# Patient Record
Sex: Male | Born: 1995 | Race: White | Hispanic: No | Marital: Single | Smoking: Never smoker
Health system: Southern US, Community
[De-identification: ages and names within clinical notes are randomized; demographics above are authoritative.]

---

## 2018-10-02 ENCOUNTER — Encounter: Payer: Self-pay | Admitting: Emergency Medicine

## 2018-10-02 ENCOUNTER — Other Ambulatory Visit: Payer: Self-pay

## 2018-10-02 ENCOUNTER — Ambulatory Visit
Admission: EM | Admit: 2018-10-02 | Discharge: 2018-10-02 | Disposition: A | Discharge status: Home / Self Care | Payer: BLUE CROSS/BLUE SHIELD | Attending: Family Medicine | Admitting: Family Medicine

## 2018-10-02 DIAGNOSIS — J039 Acute tonsillitis, unspecified: Secondary | ICD-10-CM

## 2018-10-02 DIAGNOSIS — J029 Acute pharyngitis, unspecified: Secondary | ICD-10-CM

## 2018-10-02 LAB — RAPID STREP SCREEN (MED CTR MEBANE ONLY): Streptococcus, Group A Screen (Direct): NEGATIVE

## 2018-10-02 MED ORDER — PENICILLIN G BENZATHINE 1200000 UNIT/2ML IM SUSP
1.2000 10*6.[IU] | Freq: Once | INTRAMUSCULAR | Status: AC
  Administered 2018-10-02: 1.2 10*6.[IU] via INTRAMUSCULAR

## 2018-10-02 MED ORDER — LIDOCAINE VISCOUS HCL 2 % MT SOLN: OROMUCOSAL | 0 refills | Status: AC

## 2018-10-02 MED ORDER — PREDNISONE 20 MG PO TABS: 20.0000 mg | ORAL_TABLET | Freq: Every day | ORAL | 0 refills | Status: AC

## 2018-10-02 MED ORDER — AZITHROMYCIN 250 MG PO TABS: ORAL_TABLET | ORAL | 0 refills | Status: AC

## 2018-10-02 NOTE — ED Triage Notes (Signed)
Patient c/o sore throat for a week.  Patient denies fevers.  

## 2018-10-02 NOTE — ED Provider Notes (Addendum)
[facility]    CSN: [id number] Arrival date & time: 10/02/18  0903     History   Chief Complaint Chief Complaint  Patient presents with  . Sore Throat    HPI Cody Fowler is a 22 y.o. male.   22 yo male with a c/o sore throat for one week. C/o pain with swallowing. Denies any trouble breathing, fevers, chills, shortness of breath.   The history is provided by the patient.    History reviewed. No pertinent past medical history.  There are no active problems to display for this patient.   History reviewed. No pertinent surgical history.     Home Medications    Prior to Admission medications   Medication Sig Start Date End Date Taking? Authorizing Provider  azithromycin (ZITHROMAX Z-PAK) 250 MG tablet 2 tabs po once today, then 1 tab po qd for next 4 days 10/02/18   Payton Mccallum, MD  lidocaine (XYLOCAINE) 2 % solution 20 ml gargle and spit q 6 hours prn sore throat 10/02/18   Payton Mccallum, MD  predniSONE (DELTASONE) 20 MG tablet Take 1 tablet (20 mg total) by mouth daily. 10/02/18   Payton Mccallum, MD    Family History Family History  Problem Relation Age of Onset  . Healthy Mother   . Healthy Father     Social History Social History   Tobacco Use  . Smoking status: Never Smoker  . Smokeless tobacco: Never Used  Substance Use Topics  . Alcohol use: Never    Frequency: Never  . Drug use: Never     Allergies   Patient has no known allergies.   Review of Systems Review of Systems   Physical Exam Triage Vital Signs ED Triage Vitals  Enc Vitals Group     BP 10/02/18 0920 140/86     Pulse Rate 10/02/18 0920 (!) 120     Resp 10/02/18 0920 16     Temp 10/02/18 0920 99.1 F (37.3 C)     Temp Source 10/02/18 0920 Oral     SpO2 10/02/18 0920 100 %     Weight 10/02/18 0918 130 lb (59 kg)     Height 10/02/18 0918 6' (1.829 m)     Head Circumference --      Peak Flow --      Pain Score 10/02/18 0920 5     Pain Loc --      Pain  Edu? --      Excl. in GC? --    No data found.  Updated Vital Signs BP 140/86 (BP Location: Left Arm)   Pulse (!) 111   Temp 99.1 F (37.3 C) (Oral)   Resp 16   Ht 6' (1.829 m)   Wt 59 kg   SpO2 100%   BMI 17.63 kg/m   Visual Acuity Right Eye Distance:   Left Eye Distance:   Bilateral Distance:    Right Eye Near:   Left Eye Near:    Bilateral Near:     Physical Exam  Constitutional: He appears well-developed and well-nourished. No distress.  HENT:  Head: Normocephalic and atraumatic.  Mouth/Throat: Uvula is midline. Oropharyngeal exudate and posterior oropharyngeal erythema present. Tonsils are 2+ on the right. Tonsils are 2+ on the left. Tonsillar exudate.  Neck: Normal range of motion. Neck supple. No tracheal deviation present.  Cardiovascular: Tachycardia present.  Pulmonary/Chest: Effort normal. No respiratory distress.  Lymphadenopathy:    He has cervical adenopathy.  Skin: He is not diaphoretic.  Nursing note and vitals reviewed.    UC Treatments / Results  Labs (all labs ordered are listed, but only abnormal results are displayed) Labs Reviewed  RAPID STREP SCREEN ([facility] ONLY)  CULTURE, GROUP A STREP [facility])    EKG None  Radiology No results found.  Procedures Procedures (including critical care time)  Medications Ordered in UC Medications  penicillin g benzathine (BICILLIN LA) 1200000 UNIT/2ML injection 1.2 Million Units (1.2 Million Units Intramuscular Given 10/02/18 1009)    Initial Impression / Assessment and Plan / UC Course  I have reviewed the triage vital signs and the nursing notes.  Pertinent labs & imaging results that were available during my care of the patient were reviewed by me and considered in my medical decision making (see chart for details).      Final Clinical Impressions(s) / UC Diagnoses   Final diagnoses:  Acute pharyngitis, unspecified etiology  Acute tonsillitis, unspecified etiology   Discharge  Instructions   None    ED Prescriptions    Medication Sig Dispense Auth. Provider   azithromycin (ZITHROMAX Z-PAK) 250 MG tablet 2 tabs po once today, then 1 tab po qd for next 4 days 6 each Payton Mccallum, MD   predniSONE (DELTASONE) 20 MG tablet Take 1 tablet (20 mg total) by mouth daily. 5 tablet Balen Woolum, Pamala Hurry, MD   lidocaine (XYLOCAINE) 2 % solution 20 ml gargle and spit q 6 hours prn sore throat 100 mL Payton Mccallum, MD     1. Lab results and diagnosis reviewed with patient 2. Patient given Bicillin LA 1.2 mU x 1 3. rx as per orders above; reviewed possible side effects, interactions, risks and benefits  4. Recommend supportive treatment with otc analgesics prn 5. Follow-up prn if symptoms worsen or don't improve   Controlled Substance Prescriptions [place] Controlled Substance Registry consulted? Not Applicable   Payton Mccallum, MD 10/02/18 1040    Payton Mccallum, MD 10/02/18 1043

## 2018-10-04 ENCOUNTER — Other Ambulatory Visit: Payer: Self-pay

## 2018-10-04 ENCOUNTER — Emergency Department
Admission: EM | Admit: 2018-10-04 | Discharge: 2018-10-04 | Disposition: A | Discharge status: Home / Self Care | Payer: BLUE CROSS/BLUE SHIELD | Attending: Emergency Medicine | Admitting: Emergency Medicine

## 2018-10-04 ENCOUNTER — Emergency Department: Payer: BLUE CROSS/BLUE SHIELD

## 2018-10-04 DIAGNOSIS — J029 Acute pharyngitis, unspecified: Secondary | ICD-10-CM | POA: Diagnosis present

## 2018-10-04 DIAGNOSIS — J039 Acute tonsillitis, unspecified: Secondary | ICD-10-CM | POA: Insufficient documentation

## 2018-10-04 LAB — CBC WITH DIFFERENTIAL/PLATELET
Abs Immature Granulocytes: 0.1 10*3/uL — ABNORMAL HIGH (ref 0.00–0.07)
BASOS ABS: 0.1 10*3/uL (ref 0.0–0.1)
Basophils Relative: 1 %
EOS ABS: 0 10*3/uL (ref 0.0–0.5)
EOS PCT: 0 %
HEMATOCRIT: 41.6 % (ref 39.0–52.0)
Hemoglobin: 13.7 g/dL (ref 13.0–17.0)
Immature Granulocytes: 1 %
Lymphocytes Relative: 48 %
Lymphs Abs: 3.9 10*3/uL (ref 0.7–4.0)
MCH: 28.5 pg (ref 26.0–34.0)
MCHC: 32.9 g/dL (ref 30.0–36.0)
MCV: 86.5 fL (ref 80.0–100.0)
Monocytes Absolute: 0.9 10*3/uL (ref 0.1–1.0)
Monocytes Relative: 11 %
NEUTROS PCT: 39 %
NRBC: 0 % (ref 0.0–0.2)
Neutro Abs: 3.2 10*3/uL (ref 1.7–7.7)
Platelets: 363 10*3/uL (ref 150–400)
RBC: 4.81 MIL/uL (ref 4.22–5.81)
RDW: 13.4 % (ref 11.5–15.5)
Smear Review: NORMAL
WBC MORPHOLOGY: REACTIVE
WBC: 8.1 10*3/uL (ref 4.0–10.5)

## 2018-10-04 LAB — BASIC METABOLIC PANEL
ANION GAP: 11 (ref 5–15)
BUN: 11 mg/dL (ref 6–20)
CALCIUM: 9.1 mg/dL (ref 8.9–10.3)
CO2: 24 mmol/L (ref 22–32)
CREATININE: 0.91 mg/dL (ref 0.61–1.24)
Chloride: 101 mmol/L (ref 98–111)
GFR calc non Af Amer: >60 mL/min (ref >60)
Glucose, Bld: 107 mg/dL — ABNORMAL HIGH (ref 70–99)
Potassium: 6.2 mmol/L — ABNORMAL HIGH (ref 3.5–5.1)
SODIUM: 136 mmol/L (ref 135–145)

## 2018-10-04 LAB — MONONUCLEOSIS SCREEN: Mono Screen: NEGATIVE

## 2018-10-04 MED ORDER — IOHEXOL 300 MG/ML  SOLN
75.0000 mL | Freq: Once | INTRAMUSCULAR | Status: AC | PRN
  Administered 2018-10-04: 75 mL via INTRAVENOUS
  Filled 2018-10-04: qty 75

## 2018-10-04 MED ORDER — PROMETHAZINE-CODEINE 6.25-10 MG/5ML PO SYRP: 5.0000 mL | ORAL_SOLUTION | Freq: Four times a day (QID) | ORAL | 0 refills | Status: AC | PRN

## 2018-10-04 MED ORDER — SODIUM CHLORIDE 0.9 % IV BOLUS
1000.0000 mL | Freq: Once | INTRAVENOUS | Status: AC
  Administered 2018-10-04: 1000 mL via INTRAVENOUS

## 2018-10-04 MED ORDER — METHYLPREDNISOLONE SODIUM SUCC 125 MG IJ SOLR
80.0000 mg | Freq: Once | INTRAMUSCULAR | Status: AC
  Administered 2018-10-04: 80 mg via INTRAVENOUS
  Filled 2018-10-04: qty 2

## 2018-10-04 NOTE — ED Provider Notes (Signed)
[facility] Emergency Department Provider Note   ____________________________________________   First MD Initiated Contact with Patient 10/04/18 1029     (approximate)  I have reviewed the triage vital signs and the nursing notes.   HISTORY  Chief Complaint Sore Throat    HPI Cody Fowler is a 22 y.o. male patient is here today for reevaluation of tonsillitis.  Patient was seen at urgent care clinic 3 days ago and diagnosed with tonsillitis based on exam since the rapid strep was negative.  Culture is still pending.  Patient was given [place] penicillin shot, steroids, and Zithromax.  Patient states on his third day of Zithromax.  Patient state noticed no improvement.  Patient states able to tolerate fluids but had difficulty with food.  Patient states dysphagia secondary to enlarged tonsils.  Patient rates his pain as a 7/10.  Patient described the pain is "sore".  History reviewed. No pertinent past medical history.  There are no active problems to display for this patient.   History reviewed. No pertinent surgical history.  Prior to Admission medications   Medication Sig Start Date End Date Taking? Authorizing Provider  azithromycin (ZITHROMAX Z-PAK) 250 MG tablet 2 tabs po once today, then 1 tab po qd for next 4 days 10/02/18   Payton Mccallum, MD  lidocaine (XYLOCAINE) 2 % solution 20 ml gargle and spit q 6 hours prn sore throat 10/02/18   Payton Mccallum, MD  predniSONE (DELTASONE) 20 MG tablet Take 1 tablet (20 mg total) by mouth daily. 10/02/18   Payton Mccallum, MD  promethazine-codeine (PHENERGAN WITH CODEINE) 6.25-10 MG/5ML syrup Take 5 mLs by mouth every 6 (six) hours as needed for cough. 10/04/18   Joni Reining, PA-C    Allergies Patient has no known allergies.  Family History  Problem Relation Age of Onset  . Healthy Mother   . Healthy Father     Social History Social History   Tobacco Use  . Smoking status: Never Smoker  .  Smokeless tobacco: Never Used  Substance Use Topics  . Alcohol use: Never    Frequency: Never  . Drug use: Never    Review of Systems Constitutional: No fever/chills Eyes: No visual changes. ENT: Sore throat. Cardiovascular: Denies chest pain. Respiratory: Denies shortness of breath. Gastrointestinal: No abdominal pain.  No nausea, no vomiting.  No diarrhea.  No constipation. Genitourinary: Negative for dysuria. Musculoskeletal: Negative for back pain. Skin: Negative for rash. Neurological: Negative for headaches, focal weakness or numbness.   ____________________________________________   PHYSICAL EXAM:  VITAL SIGNS: ED Triage Vitals  Enc Vitals Group     BP 10/04/18 1021 138/67     Pulse Rate 10/04/18 1021 (!) 122     Resp 10/04/18 1021 16     Temp 10/04/18 1021 98.5 F (36.9 C)     Temp Source 10/04/18 1021 Oral     SpO2 10/04/18 1021 100 %     Weight 10/04/18 1022 127 lb 13.9 oz (58 kg)     Height 10/04/18 1022 6' (1.829 m)     Head Circumference --      Peak Flow --      Pain Score 10/04/18 1021 7     Pain Loc --      Pain Edu? --      Excl. in GC? --    Constitutional: Alert and oriented. Well appearing and in no acute distress. Afebrile. Mouth/Throat: Mucous membranes are moist.  Oropharynx non-erythematous. Neck: No stridor.  Hematological/Lymphatic/Immunilogical: Bilateral cervical lymphadenopathy. Cardiovascular: Cardiac, regular rhythm. Grossly normal heart sounds.  Good peripheral circulation. Respiratory: Normal respiratory effort.  No retractions. Lungs CTAB. Neurologic:  Normal speech and language. No gross focal neurologic deficits are appreciated. No gait instability. Skin:  Skin is warm, dry and intact. No rash noted. Psychiatric: Mood and affect are normal. Speech and behavior are normal.  ____________________________________________   LABS (all labs ordered are listed, but only abnormal results are displayed)  Labs Reviewed  BASIC  METABOLIC PANEL - Abnormal; Notable for the following components:      Result Value   Potassium 6.2 (*)    Glucose, Bld 107 (*)    All other components within normal limits  CBC WITH DIFFERENTIAL/PLATELET - Abnormal; Notable for the following components:   Abs Immature Granulocytes 0.10 (*)    All other components within normal limits  MONONUCLEOSIS SCREEN   ____________________________________________  EKG   ____________________________________________  RADIOLOGY  ED MD interpretation:    Official radiology report(s): Ct Soft Tissue Neck W Contrast  Result Date: 10/04/2018 CLINICAL DATA:  Worsening pain with trouble breathing. Recently diagnosed with tonsillitis. EXAM: CT NECK WITH CONTRAST TECHNIQUE: Multidetector CT imaging of the neck was performed using the standard protocol following the bolus administration of intravenous contrast. CONTRAST:  75mL OMNIPAQUE IOHEXOL 300 MG/ML  SOLN COMPARISON:  None. FINDINGS: Pharynx and larynx: The palatine and adenoid tonsils are markedly thickened. Tonsils have a striated appearance from inflammation and probable surface purulence. There is no abscess or retropharyngeal edema. The supraglottic larynx shows no submucosal edema or narrowing. Salivary glands: No inflammation, mass, or stone. Thyroid: Normal. Lymph nodes: Enlarged homogeneous nodes throughout the bilateral jugular chains and in the submental spaces. Vascular: Major venous structures are patent Limited intracranial: Negative Visualized orbits: Negative Mastoids and visualized paranasal sinuses: Essentially clear Skeleton: Negative Upper chest: Small cluster of irregular ground-glass densities in the right upper lobe IMPRESSION: 1. Tonsillitis and cervical adenitis without abscess. 2. Mild right upper lobe opacity, likely infectious/inflammatory. Electronically Signed   By: Marnee SpringJonathon  Watts M.D.   On: 10/04/2018 12:51     ____________________________________________   PROCEDURES  Procedure(s) performed:   Procedures  Critical Care performed:   ____________________________________________   INITIAL IMPRESSION / ASSESSMENT AND PLAN / ED COURSE  As part of my medical decision making, I reviewed the following data within the electronic MEDICAL RECORD NUMBER    Patient presents with tonsillitis with no improvement after 3 days of antibiotics and steroids.  CT soft tissue neck confirmed tonsillitis with an inflammatory/infectious process.  Patient advised to continue previous medications.  Patient get a prescription for Phenergan with codeine to mix with his viscous lidocaine for swish and swallow.  Patient advised follow-up with the ENT clinic if there is no improvement in 2 days.      ____________________________________________   FINAL CLINICAL IMPRESSION(S) / ED DIAGNOSES  Final diagnoses:  Tonsillitis     ED Discharge Orders         Ordered    promethazine-codeine (PHENERGAN WITH CODEINE) 6.25-10 MG/5ML syrup  Every 6 hours PRN     10/04/18 1321           Note:  This document was prepared using Dragon voice recognition software and may include unintentional dictation errors.    Joni ReiningSmith, Ronald K, PA-C 10/04/18 1322    Emily FilbertWilliams, Jonathan E, MD 10/07/18 [phone]

## 2018-10-04 NOTE — Discharge Instructions (Addendum)
Continue previous medications and follow-up with the [facility] if no improvement.

## 2018-10-04 NOTE — ED Notes (Signed)
See triage note  States he was seen and placed on zpak and prednisone   This is the 3rd day of meds  Feels like throat is more swollen  Speech is muffled  Tonsils swollen with white exudate noted

## 2018-10-04 NOTE — ED Triage Notes (Signed)
Pt came to Ed via pov. Went to urgent care and was diagnosed with tonsilitis. Was given prednisone and azithromycin and today would be day 3 of patient taking meds. Has not taken them yet today. Pt reports pain is getting worse. Denies trouble breathing.

## 2018-10-05 LAB — CULTURE, GROUP A STREP (THRC)

## 2018-10-14 ENCOUNTER — Telehealth: Payer: Self-pay | Admitting: Emergency Medicine

## 2018-10-14 NOTE — Telephone Encounter (Signed)
called patient regarding FMLA paper work.  No answer and no voice mail set up.  [facility].  Paper work placed in envelope and left with front office staff ready for pick up.

## 2019-07-09 IMAGING — CT CT NECK W/ CM
3 of 5 series · 13 of 33 positions shown, 16 images · IV contrast (omnipaque)
Comparison: None.

CLINICAL DATA: Worsening pain with trouble breathing. Recently
diagnosed with tonsillitis.

EXAM:
CT NECK WITH CONTRAST
TECHNIQUE: Multidetector CT imaging of the neck was performed using the
standard protocol following the bolus administration of intravenous
contrast.
CONTRAST:  75mL OMNIPAQUE IOHEXOL 300 MG/ML  SOLN

[Series 6: sag neck · sagittal · 0.58mm/px · 5 of 150 slices shown, 6 images]
[im 50/150  bone]
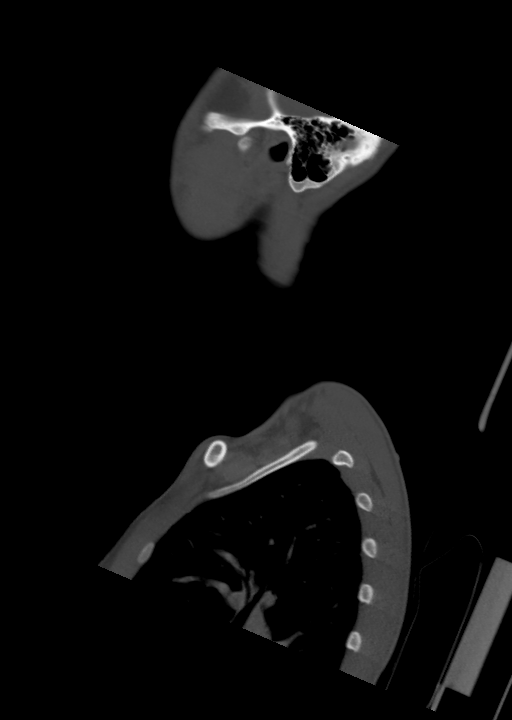
[im 63/150  bone]
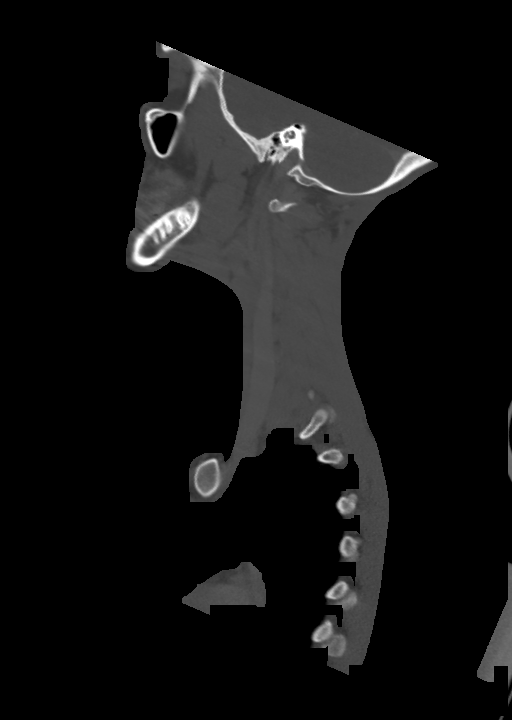
[im 75/150  soft-tissue]
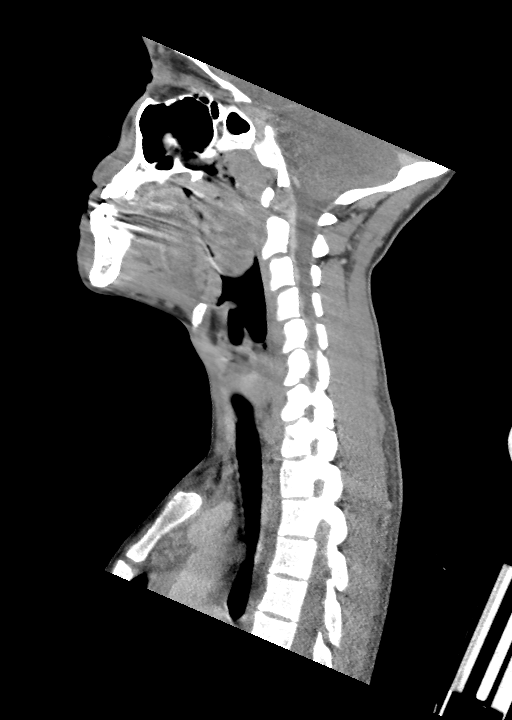
[im 75/150  bone]
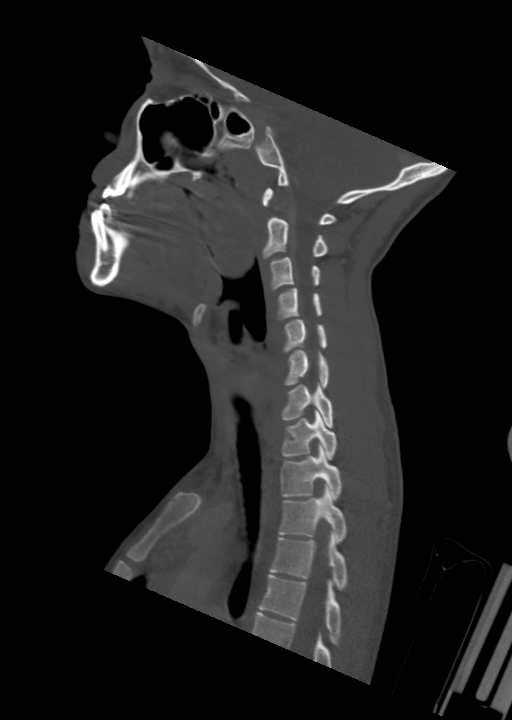
[im 87/150  bone]
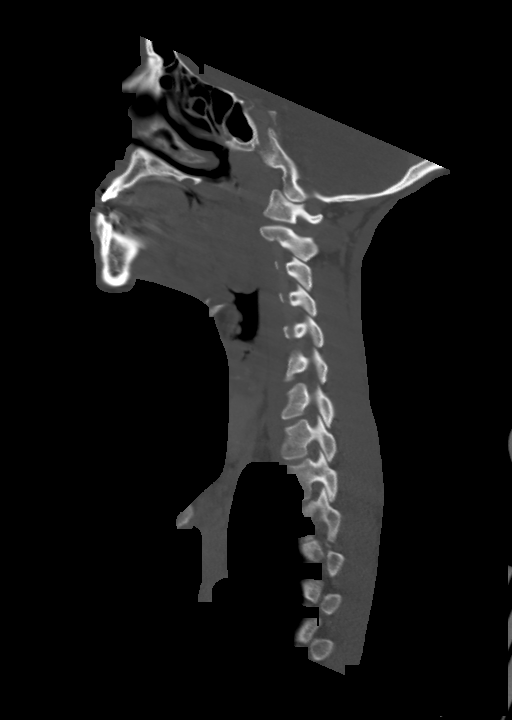
[im 100/150  bone]
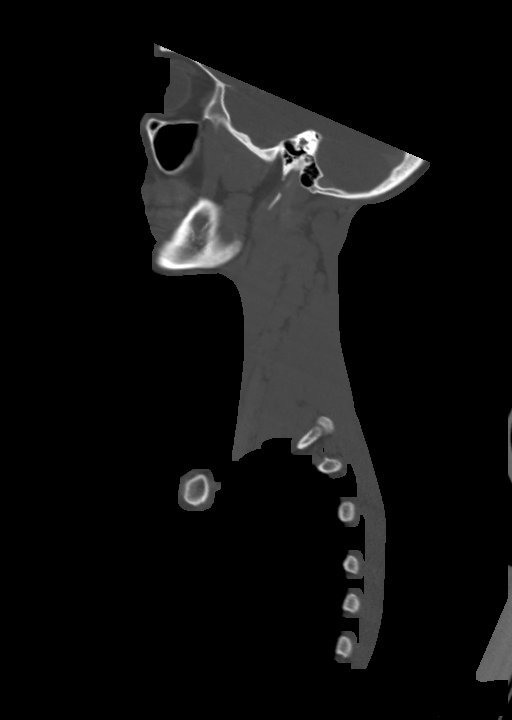

[Series 7: cor neck · coronal · 0.57mm/px · 3 of 148 slices shown]
[im 52/148  bone]
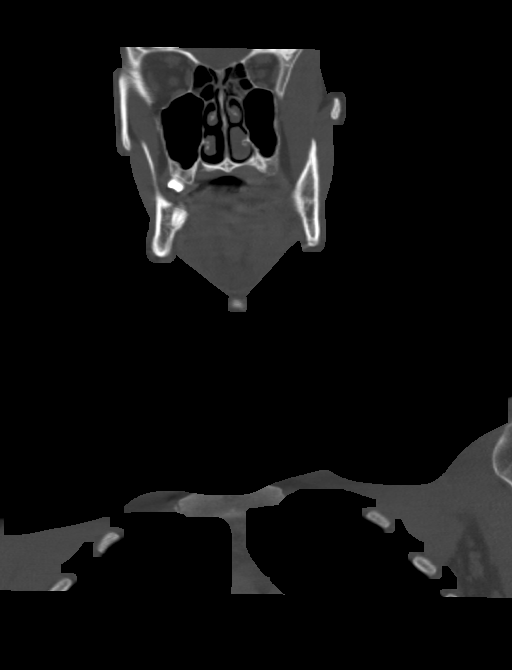
[im 67/148  bone]
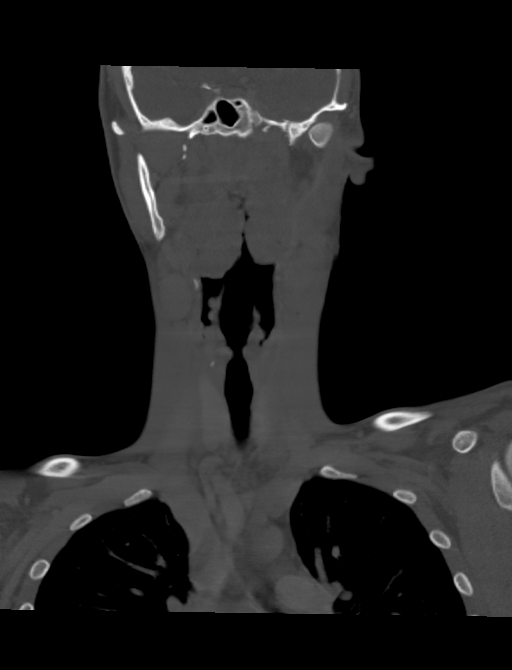
[im 81/148  bone]
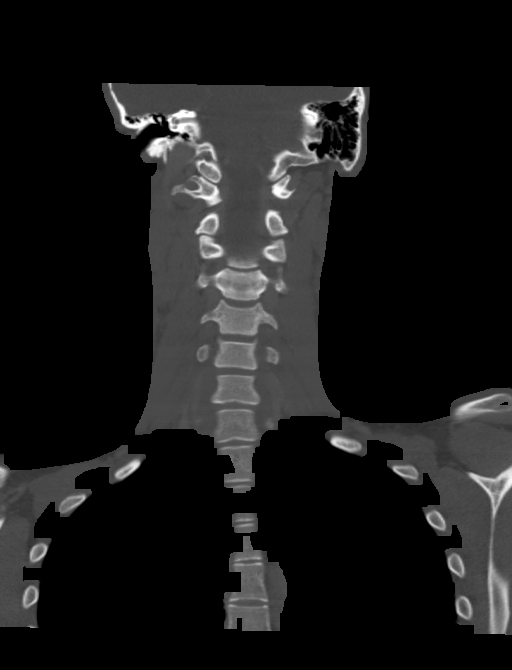

[Series 8: orthogonal ax · axial · 0.58mm/px · z∈[-94,+135]mm · 5 of 188 slices shown, 7 images]
[im 32/188  soft-tissue]
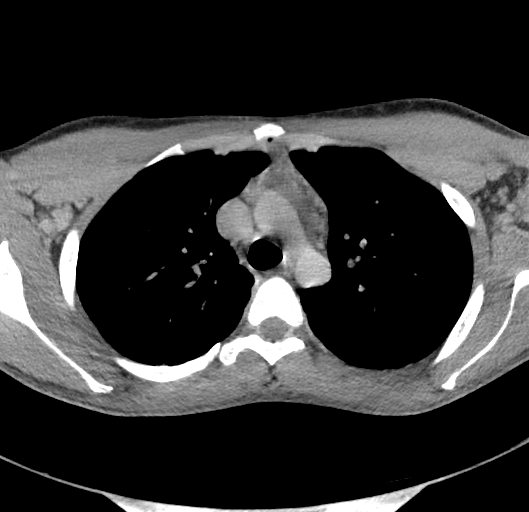
[im 32/188  bone]
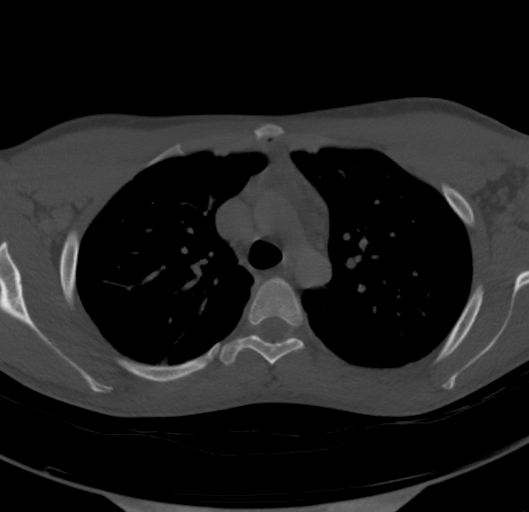
[im 63/188  bone]
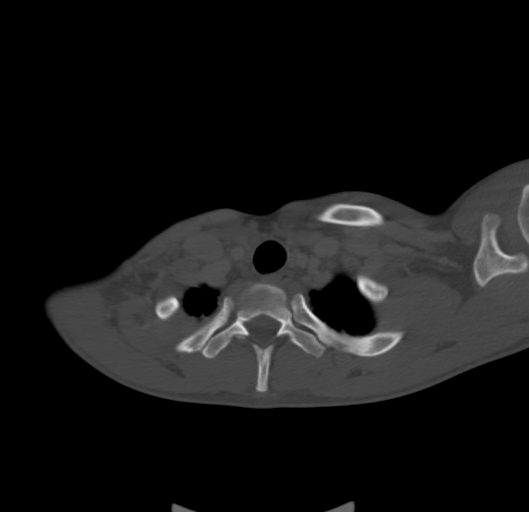
[im 94/188  bone]
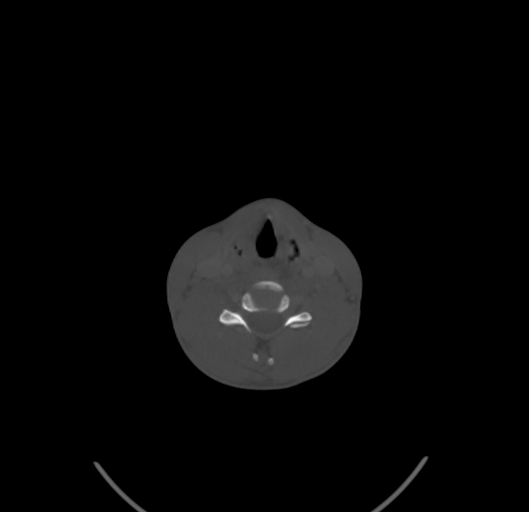
[im 125/188  bone]
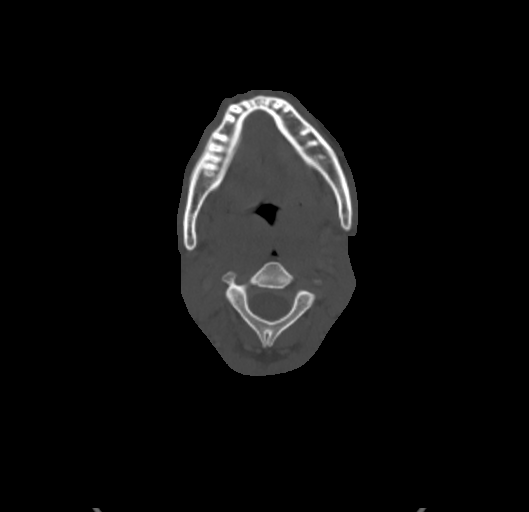
[im 156/188  soft-tissue]
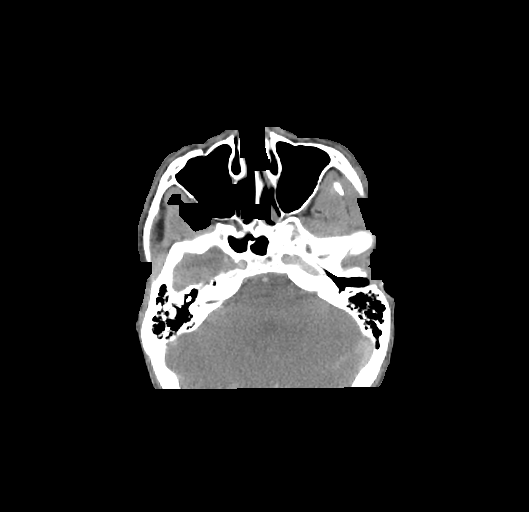
[im 156/188  bone]
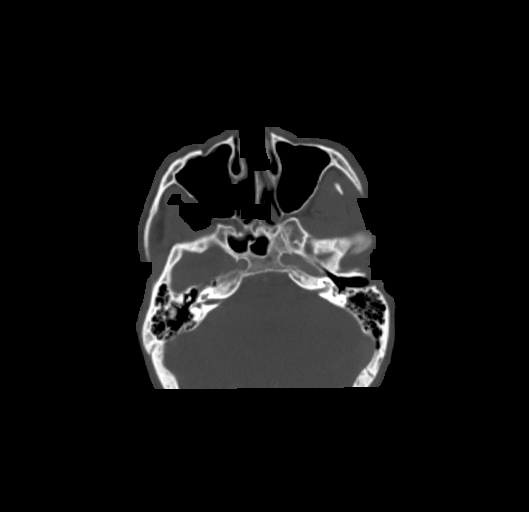

[13 of 33 positions shown; findings below may reference images not displayed]

FINDINGS: Pharynx and larynx: The palatine and adenoid tonsils are markedly
thickened. Tonsils have a striated appearance from inflammation and
probable surface purulence. There is no abscess or retropharyngeal
edema. The supraglottic larynx shows no submucosal edema or
narrowing.

Salivary glands: No inflammation, mass, or stone.

Thyroid: Normal.

Lymph nodes: Enlarged homogeneous nodes throughout the bilateral
jugular chains and in the submental spaces.

Vascular: Major venous structures are patent

Limited intracranial: Negative

Visualized orbits: Negative

Mastoids and visualized paranasal sinuses: Essentially clear

Skeleton: Negative

Upper chest: Small cluster of irregular ground-glass densities in
the right upper lobe
IMPRESSION: 1. Tonsillitis and cervical adenitis without abscess.
2. Mild right upper lobe opacity, likely infectious/inflammatory.

## 2019-10-09 ENCOUNTER — Other Ambulatory Visit: Payer: Self-pay

## 2019-10-09 DIAGNOSIS — Z20822 Contact with and (suspected) exposure to covid-19: Secondary | ICD-10-CM

## 2019-10-11 LAB — NOVEL CORONAVIRUS, NAA: SARS-CoV-2, NAA: NOT DETECTED

## 2019-10-12 ENCOUNTER — Telehealth: Payer: Self-pay | Admitting: General Practice

## 2019-10-12 NOTE — Telephone Encounter (Signed)
Negative COVID results given. Patient results "NOT Detected." Caller expressed understanding. ° °

## 2020-04-15 ENCOUNTER — Other Ambulatory Visit: Payer: Self-pay

## 2020-04-15 ENCOUNTER — Encounter: Payer: Self-pay | Admitting: Emergency Medicine

## 2020-04-15 ENCOUNTER — Ambulatory Visit
Admission: EM | Admit: 2020-04-15 | Discharge: 2020-04-15 | Disposition: A | Discharge status: Home / Self Care | Payer: BC Managed Care – PPO | Attending: Family Medicine | Admitting: Family Medicine

## 2020-04-15 DIAGNOSIS — J039 Acute tonsillitis, unspecified: Secondary | ICD-10-CM | POA: Diagnosis not present

## 2020-04-15 LAB — GROUP A STREP BY PCR: Group A Strep by PCR: NOT DETECTED

## 2020-04-15 MED ORDER — AMOXICILLIN 875 MG PO TABS: 875.0000 mg | ORAL_TABLET | Freq: Two times a day (BID) | ORAL | 0 refills | Status: DC

## 2020-04-15 NOTE — Discharge Instructions (Signed)
Salt water gargles Tylenol/advil as needed  

## 2020-04-15 NOTE — ED Provider Notes (Signed)
[facility]    CSN: [id number] Arrival date & time: 04/15/20  1430      History   Chief Complaint Chief Complaint  Patient presents with  . Sore Throat    HPI Cody Fowler is a 24 y.o. male.   24 yo male with a c/o sore throat for the past 2-3 days. States it's painful to swallow. Denies any nasal congestion, runny nose, cough, ear pain, drooling, trouble breathing. No known sick contacts. Otherwise healthy.    Sore Throat    History reviewed. No pertinent past medical history.  There are no problems to display for this patient.   History reviewed. No pertinent surgical history.     Home Medications    Prior to Admission medications   Medication Sig Start Date End Date Taking? Authorizing Provider  amoxicillin (AMOXIL) 875 MG tablet Take 1 tablet (875 mg total) by mouth 2 (two) times daily. 04/15/20   Payton Mccallum, MD  azithromycin (ZITHROMAX Z-PAK) 250 MG tablet 2 tabs po once today, then 1 tab po qd for next 4 days 10/02/18   Payton Mccallum, MD  lidocaine (XYLOCAINE) 2 % solution 20 ml gargle and spit q 6 hours prn sore throat 10/02/18   Payton Mccallum, MD  predniSONE (DELTASONE) 20 MG tablet Take 1 tablet (20 mg total) by mouth daily. 10/02/18   Payton Mccallum, MD  promethazine-codeine (PHENERGAN WITH CODEINE) 6.25-10 MG/5ML syrup Take 5 mLs by mouth every 6 (six) hours as needed for cough. 10/04/18   Joni Reining, PA-C    Family History Family History  Problem Relation Age of Onset  . Healthy Mother   . Healthy Father     Social History Social History   Tobacco Use  . Smoking status: Never Smoker  . Smokeless tobacco: Never Used  Substance Use Topics  . Alcohol use: Never  . Drug use: Never     Allergies   Patient has no known allergies.   Review of Systems Review of Systems   Physical Exam Triage Vital Signs ED Triage Vitals  Enc Vitals Group     BP 04/15/20 1500 124/83     Pulse Rate 04/15/20 1500 79     Resp  04/15/20 1500 18     Temp 04/15/20 1500 98.6 F (37 C)     Temp Source 04/15/20 1500 Oral     SpO2 04/15/20 1500 100 %     Weight 04/15/20 1457 127 lb 13.9 oz (58 kg)     Height 04/15/20 1457 6' (1.829 m)     Head Circumference --      Peak Flow --      Pain Score 04/15/20 1457 0     Pain Loc --      Pain Edu? --      Excl. in GC? --    No data found.  Updated Vital Signs BP 124/83 (BP Location: Right Arm)   Pulse 79   Temp 98.6 F (37 C) (Oral)   Resp 18   Ht 6' (1.829 m)   Wt 58 kg   SpO2 100%   BMI 17.34 kg/m   Visual Acuity Right Eye Distance:   Left Eye Distance:   Bilateral Distance:    Right Eye Near:   Left Eye Near:    Bilateral Near:     Physical Exam Vitals and nursing note reviewed.  Constitutional:      General: He is not in acute distress.    Appearance: He is  not toxic-appearing or diaphoretic.  HENT:     Mouth/Throat:     Pharynx: Uvula midline. Oropharyngeal exudate and posterior oropharyngeal erythema present.     Tonsils: Tonsillar exudate present. 2+ on the right. 2+ on the left.  Pulmonary:     Effort: Pulmonary effort is normal. No respiratory distress.  Musculoskeletal:     Cervical back: Neck [name].  Neurological:     Mental Status: He is alert.      UC Treatments / Results  Labs (all labs ordered are listed, but only abnormal results are displayed) Labs Reviewed  GROUP A STREP BY PCR    EKG   Radiology No results found.  Procedures Procedures (including critical care time)  Medications Ordered in UC Medications - No data to display  Initial Impression / Assessment and Plan / UC Course  I have reviewed the triage vital signs and the nursing notes.  Pertinent labs & imaging results that were available during my care of the patient were reviewed by me and considered in my medical decision making (see chart for details).     Final Clinical Impressions(s) / UC Diagnoses   Final diagnoses:  Tonsillitis      Discharge Instructions     Salt water gargles Tylenol/advil as needed    ED Prescriptions    Medication Sig Dispense Auth. Provider   amoxicillin (AMOXIL) 875 MG tablet Take 1 tablet (875 mg total) by mouth 2 (two) times daily. 20 tablet Norval Gable, MD      1. diagnosis reviewed with patient 2. rx as per orders above; reviewed possible side effects, interactions, risks and benefits  3. Recommend supportive treatment with otc analgesics, salt water gargles 4. Follow-up prn if symptoms worsen or don't improve  PDMP not reviewed this encounter.   Norval Gable, MD 04/15/20 1538

## 2020-04-15 NOTE — ED Triage Notes (Signed)
Pt c/o sore throat. Started about 2 days ago. He states his tonsil on the right side is swollen. He states it does not hurt today. He has had tonsillitis in the past and states this feels similar.

## 2024-12-08 ENCOUNTER — Ambulatory Visit
Admission: EM | Admit: 2024-12-08 | Discharge: 2024-12-08 | Disposition: A | Discharge status: Home / Self Care | Attending: Emergency Medicine | Admitting: Emergency Medicine

## 2024-12-08 ENCOUNTER — Encounter: Payer: Self-pay | Admitting: Emergency Medicine

## 2024-12-08 DIAGNOSIS — J029 Acute pharyngitis, unspecified: Secondary | ICD-10-CM | POA: Insufficient documentation

## 2024-12-08 LAB — POCT RAPID STREP A (OFFICE): Rapid Strep A Screen: NEGATIVE

## 2024-12-08 NOTE — ED Triage Notes (Signed)
 Pt c/o sore throat. Started about 3 days ago. Denies fever or there URI symptoms.

## 2024-12-08 NOTE — Discharge Instructions (Addendum)
 Your strep test is negative, throat culture pending, if your culture is positive we will call you and amoxicillin , if your culture is negative you will not be notified.  Please check MyChart for results.  Push plenty of fluids, avoid caffeine, may alternate and take Tylenol /ibuprofen as label directed for pain

## 2024-12-08 NOTE — ED Provider Notes (Signed)
 " [facility]    CSN: [id number] Arrival date & time: 12/08/24  1509      History   Chief Complaint Chief Complaint  Patient presents with   Sore Throat    HPI Cody Fowler is a 29 y.o. male.   29 year old male pt, Cody Fowler, presents to urgent care for evaluation of sore throat x 3 days. Pt denies fever or URI symptoms.    The history is provided by the patient. No language interpreter was used.  Sore Throat    History reviewed. No pertinent past medical history.  Patient Active Problem List   Diagnosis Date Noted   Viral pharyngitis 12/08/2024    History reviewed. No pertinent surgical history.     Home Medications    Prior to Admission medications  Medication Sig Start Date End Date Taking? Authorizing Provider  amoxicillin  (AMOXIL ) 875 MG tablet Take 1 tablet (875 mg total) by mouth 2 (two) times daily. 04/15/20   Servando Hire, MD  azithromycin  (ZITHROMAX  Z-PAK) 250 MG tablet 2 tabs po once today, then 1 tab po qd for next 4 days 10/02/18   Servando Hire, MD  lidocaine  (XYLOCAINE ) 2 % solution 20 ml gargle and spit q 6 hours prn sore throat 10/02/18   Servando Hire, MD  predniSONE  (DELTASONE ) 20 MG tablet Take 1 tablet (20 mg total) by mouth daily. 10/02/18   Servando Hire, MD  promethazine -codeine  (PHENERGAN  WITH CODEINE ) 6.25-10 MG/5ML syrup Take 5 mLs by mouth every 6 (six) hours as needed for cough. 10/04/18   Claudene Tanda POUR, PA-C    Family History Family History  Problem Relation Age of Onset   Healthy Mother    Healthy Father     Social History Social History[1]   Allergies   Patient has no known allergies.   Review of Systems Review of Systems  Constitutional:  Negative for fever.  HENT:  Positive for sore throat.   All other systems reviewed and are negative.    Physical Exam Triage Vital Signs ED Triage Vitals  Encounter Vitals Group     BP 12/08/24 1523 137/78     Girls Systolic BP Percentile --      Girls  Diastolic BP Percentile --      Boys Systolic BP Percentile --      Boys Diastolic BP Percentile --      Pulse Rate 12/08/24 1523 90     Resp 12/08/24 1523 16     Temp 12/08/24 1523 98.9 F (37.2 C)     Temp Source 12/08/24 1523 Oral     SpO2 12/08/24 1523 96 %     Weight 12/08/24 1518 127 lb 13.9 oz (58 kg)     Height 12/08/24 1518 6' (1.829 m)     Head Circumference --      Peak Flow --      Pain Score 12/08/24 1517 0     Pain Loc --      Pain Education --      Exclude from Growth Chart --    No data found.  Updated Vital Signs BP 137/78 (BP Location: Right Arm)   Pulse 90   Temp 98.9 F (37.2 C) (Oral)   Resp 16   Ht 6' (1.829 m)   Wt 127 lb 13.9 oz (58 kg)   SpO2 96%   BMI 17.34 kg/m   Visual Acuity Right Eye Distance:   Left Eye Distance:   Bilateral Distance:  Right Eye Near:   Left Eye Near:    Bilateral Near:     Physical Exam Vitals and nursing note reviewed.  Constitutional:      Appearance: Normal appearance. He is well-developed and well-groomed.  HENT:     Head: Normocephalic.     Right Ear: Tympanic membrane is retracted.     Left Ear: Tympanic membrane is retracted.     Nose: Nose normal.     Mouth/Throat:     Lips: Pink.     Mouth: Mucous membranes are moist.     Pharynx: Uvula midline. Posterior oropharyngeal erythema present.     Tonsils: No tonsillar exudate or tonsillar abscesses.  Cardiovascular:     Rate and Rhythm: Normal rate and regular rhythm.     Heart sounds: Normal heart sounds.  Pulmonary:     Effort: Pulmonary effort is normal.     Breath sounds: Normal breath sounds and air entry.  Musculoskeletal:     Cervical back: Normal range of motion and neck supple.  Lymphadenopathy:     Cervical: No cervical adenopathy.  Neurological:     General: No focal deficit present.     Mental Status: He is alert and oriented to person, place, and time.     GCS: GCS eye subscore is 4. GCS verbal subscore is 5. GCS motor subscore is 6.   Psychiatric:        Attention and Perception: Attention normal.        Mood and Affect: Mood normal.        Speech: Speech normal.        Behavior: Behavior normal. Behavior is cooperative.      UC Treatments / Results  Labs (all labs ordered are listed, but only abnormal results are displayed) Labs Reviewed  POCT RAPID STREP A (OFFICE) - Normal  CULTURE, GROUP A STREP [facility])    EKG   Radiology No results found.  Procedures Procedures (including critical care time)  Medications Ordered in UC Medications - No data to display  Initial Impression / Assessment and Plan / UC Course  I have reviewed the triage vital signs and the nursing notes.  Pertinent labs & imaging results that were available during my care of the patient were reviewed by me and considered in my medical decision making (see chart for details).     Discussed exam findings and plan of care with patient : your strep test is negative, throat culture pending, if your culture is positive we will call you and amoxicillin , if your culture is negative you will not be notified.  Please check MyChart for results.  Push plenty of fluids, avoid caffeine, may alternate and take Tylenol /ibuprofen as label directed for pain.  Patient verbalized understanding to this provider.  Ddx: Viral pharyngitis, allergies, strep Final Clinical Impressions(s) / UC Diagnoses   Final diagnoses:  Sore throat  Viral pharyngitis     Discharge Instructions      Your strep test is negative, throat culture pending, if your culture is positive we will call you and amoxicillin , if your culture is negative you will not be notified.  Please check MyChart for results.  Push plenty of fluids, avoid caffeine, may alternate and take Tylenol /ibuprofen as label directed for pain     ED Prescriptions   None    PDMP not reviewed this encounter.      [1]  Social History Tobacco Use   Smoking status: Never   Smokeless tobacco:  Never  Vaping Use   Vaping status: Never Used  Substance Use Topics   Alcohol use: Never   Drug use: Never     Shahed Yeoman, Rilla, NP 12/08/24 1903  "

## 2024-12-11 ENCOUNTER — Ambulatory Visit (HOSPITAL_COMMUNITY): Payer: Self-pay

## 2024-12-11 LAB — CULTURE, GROUP A STREP (THRC)

## 2024-12-12 MED ORDER — AMOXICILLIN 500 MG PO CAPS: 500.0000 mg | ORAL_CAPSULE | Freq: Two times a day (BID) | ORAL | 0 refills | Status: DC

## 2024-12-22 ENCOUNTER — Telehealth (HOSPITAL_COMMUNITY): Payer: Self-pay

## 2024-12-22 MED ORDER — AMOXICILLIN 500 MG PO CAPS: 500.0000 mg | ORAL_CAPSULE | Freq: Two times a day (BID) | ORAL | 0 refills | Status: AC
